# Patient Record
Sex: Male | Born: 2005 | Race: Black or African American | Hispanic: No | Marital: Single | Smoking: Never smoker
Health system: Southern US, Community
[De-identification: ages and names within clinical notes are randomized; demographics above are authoritative.]

## PROBLEM LIST (undated history)

## (undated) DIAGNOSIS — J45909 Unspecified asthma, uncomplicated: Secondary | ICD-10-CM

## (undated) DIAGNOSIS — J189 Pneumonia, unspecified organism: Secondary | ICD-10-CM

## (undated) HISTORY — PX: WISDOM TOOTH EXTRACTION: SHX21

---
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
## 2024-04-18 ENCOUNTER — Emergency Department (HOSPITAL_COMMUNITY)
Admission: EM | Admit: 2024-04-18 | Discharge: 2024-04-18 | Disposition: A | Discharge status: Home / Self Care | Attending: Student | Admitting: Student

## 2024-04-18 ENCOUNTER — Other Ambulatory Visit: Payer: Self-pay

## 2024-04-18 ENCOUNTER — Encounter (HOSPITAL_COMMUNITY): Payer: Self-pay

## 2024-04-18 DIAGNOSIS — R001 Bradycardia, unspecified: Secondary | ICD-10-CM | POA: Diagnosis not present

## 2024-04-18 DIAGNOSIS — T424X1A Poisoning by benzodiazepines, accidental (unintentional), initial encounter: Secondary | ICD-10-CM | POA: Insufficient documentation

## 2024-04-18 DIAGNOSIS — T50904A Poisoning by unspecified drugs, medicaments and biological substances, undetermined, initial encounter: Secondary | ICD-10-CM | POA: Diagnosis not present

## 2024-04-18 DIAGNOSIS — Z7722 Contact with and (suspected) exposure to environmental tobacco smoke (acute) (chronic): Secondary | ICD-10-CM | POA: Insufficient documentation

## 2024-04-18 DIAGNOSIS — Z7952 Long term (current) use of systemic steroids: Secondary | ICD-10-CM | POA: Diagnosis not present

## 2024-04-18 DIAGNOSIS — F22 Delusional disorders: Secondary | ICD-10-CM | POA: Diagnosis not present

## 2024-04-18 DIAGNOSIS — T887XXA Unspecified adverse effect of drug or medicament, initial encounter: Secondary | ICD-10-CM | POA: Diagnosis not present

## 2024-04-18 DIAGNOSIS — Z7951 Long term (current) use of inhaled steroids: Secondary | ICD-10-CM | POA: Diagnosis not present

## 2024-04-18 DIAGNOSIS — T50901A Poisoning by unspecified drugs, medicaments and biological substances, accidental (unintentional), initial encounter: Secondary | ICD-10-CM

## 2024-04-18 DIAGNOSIS — J4520 Mild intermittent asthma, uncomplicated: Secondary | ICD-10-CM | POA: Diagnosis not present

## 2024-04-18 LAB — RAPID URINE DRUG SCREEN, HOSP PERFORMED
Amphetamines: POSITIVE — AB
Barbiturates: NOT DETECTED
Benzodiazepines: NOT DETECTED
Cocaine: NOT DETECTED
Opiates: NOT DETECTED
Tetrahydrocannabinol: POSITIVE — AB

## 2024-04-18 NOTE — ED Triage Notes (Signed)
 Pt took a xanax from a friend Tuesday and states it was laced bc he feels foggy and paranoia since taking med. Axox4. VSS. Denies hallucinations

## 2024-04-18 NOTE — ED Provider Notes (Signed)
 [place] [facility]MENT AT [facility] Provider Note  CSN: [id number] Arrival date & time: 04/18/24 1145  Chief Complaint(s) Drug Problem  HPI Darryl Marsh is a 18 y.o. male who presents emergency room for evaluation of an accidental drug exposure.  Patient states that he used Xanax recreationally a few days ago and has been feeling paranoid since taking this medication.  He states he initially felt drowsy but awoke paranoid and this is an atypical feeling for him.  Endorses using nicotine vapes and smoking marijuana but denies smoking any additional substances or coingestion.  Currently denies chest pain, shortness of breath, abdominal pain, nausea, vomiting or other systemic symptoms.   Past Medical History Past Medical History:  Diagnosis Date   Asthma    Pneumonia    a few years ago, when wheezing started   Patient Active Problem List   Diagnosis Date Noted   Encounter for routine child health examination without abnormal findings 08/14/2017   BMI (body mass index), pediatric, > 99% for age 98/02/2017   Mild intermittent asthma without complication 08/14/2017   Passive smoke exposure 08/14/2017   Status asthmaticus 09/17/2015   Home Medication(s) Prior to Admission medications   Medication Sig Start Date End Date Taking? Authorizing Provider  albuterol  (VENTOLIN  HFA) 108 (90 Base) MCG/ACT inhaler Inhale 2 puffs into the lungs every 4 (four) hours as needed for wheezing or shortness of breath. 10/19/22   Lenord Radon, DO  cetirizine  (ZYRTEC ) 10 MG tablet Take 1 tablet (10 mg total) by mouth daily. 10/19/22   Lenord Radon, DO  fluticasone  (FLONASE ) 50 MCG/ACT nasal spray Place 2 sprays into both nostrils daily. 10/19/22   Lenord Radon, DO  naproxen  (NAPROSYN ) 375 MG tablet Take 1 tablet (375 mg total) by mouth 2 (two) times daily with a meal. 08/06/22   Harris, Abigail, PA-C  predniSONE  (DELTASONE ) 20 MG tablet 3 tabs daily x 5 days, then 2  tabs/day x 2 days, 1 tab/day x 2 days, 1/2 tab/day x 2 days 08/11/22   Lesle Ras, MD  Spacer/Aero-Holding Chambers (AEROCHAMBER PLUS WITH MASK- SMALL) MISC 1 each by Other route once. 05/31/15   Ainsley Houston, MD                                                                                                                                    Past Surgical History Past Surgical History:  Procedure Laterality Date   WISDOM TOOTH EXTRACTION     Family History Family History  Problem Relation Age of Onset   COPD Paternal Grandmother    Hypertension Maternal Grandfather    ADD / ADHD Neg Hx    Cancer Neg Hx    Birth defects Neg Hx    Asthma Neg Hx    Arthritis Neg Hx    Anxiety disorder Neg Hx    Alcohol abuse Neg Hx    Depression Neg Hx  Hyperlipidemia Neg Hx    Heart disease Neg Hx    Hearing loss Neg Hx    Early death Neg Hx    Drug abuse Neg Hx    Diabetes Neg Hx    Intellectual disability Neg Hx    Kidney disease Neg Hx    Learning disabilities Neg Hx    Miscarriages / Stillbirths Neg Hx    Obesity Neg Hx    Stroke Neg Hx    Vision loss Neg Hx    Varicose Veins Neg Hx     Social History Social History   Tobacco Use   Smoking status: Never    Passive exposure: Current   Smokeless tobacco: Never   Tobacco comments:    mom smokes outside:  mom stopped now  Substance Use Topics   Alcohol use: No   Drug use: No   Allergies No healthtouch food allergies  Review of Systems Review of Systems  Psychiatric/Behavioral:         Paranoia    Physical Exam Vital Signs  I have reviewed the triage vital signs BP 128/83 (BP Location: Left Arm)   Pulse 63   Temp 97.6 F (36.4 C) (Oral)   Resp 17   Ht 5\' 9"  (1.753 m)   Wt 95.3 kg   SpO2 100%   BMI 31.01 kg/m   Physical Exam Constitutional:      General: He is not in acute distress.    Appearance: Normal appearance.  HENT:     Head: Normocephalic and atraumatic.     Nose: No congestion or rhinorrhea.   Eyes:     General:        Right eye: No discharge.        Left eye: No discharge.     Extraocular Movements: Extraocular movements intact.     Pupils: Pupils are equal, round, and reactive to light.  Cardiovascular:     Rate and Rhythm: Normal rate and regular rhythm.     Heart sounds: No murmur heard. Pulmonary:     Effort: No respiratory distress.     Breath sounds: No wheezing or rales.  Abdominal:     General: There is no distension.     Tenderness: There is no abdominal tenderness.  Musculoskeletal:        General: Normal range of motion.     Cervical back: Normal range of motion.  Skin:    General: Skin is warm and dry.  Neurological:     General: No focal deficit present.     Mental Status: He is alert.     ED Results and Treatments Labs (all labs ordered are listed, but only abnormal results are displayed) Labs Reviewed  RAPID URINE DRUG SCREEN, HOSP PERFORMED - Abnormal; Notable for the following components:      Result Value   Amphetamines POSITIVE (*)    Tetrahydrocannabinol POSITIVE (*)    All other components within normal limits  Radiology No results found.  Pertinent labs & imaging results that were available during my care of the patient were reviewed by me and considered in my medical decision making (see MDM for details).  Medications Ordered in ED Medications - No data to display                                                                                                                                   Procedures Procedures  (including critical care time)  Medical Decision Making / ED Course   This patient presents to the ED for concern of paranoia, this involves an extensive number of treatment options, and is a complaint that carries with it a high risk of complications and morbidity.  The differential diagnosis  includes sympathomimetic exposure, polysubstance use, drug withdrawal, schizophrenia, psychiatric illness  MDM: Patient seen for evaluation of paranoia.  Physical exam is unremarkable.  UDS is positive for THC and amphetamines.  The amphetamines do come as a surprise to this patient and he states that he has not been using Adderall or other prescription sympathomimetics.  This is likely the exposure that is causing patient's paranoia and at this time he is not displaying any evidence of psychosis here in the department.  Currently does not meet inpatient criteria for admission or criteria for emergency psychiatric consultation.  He was encouraged to not use recreational drugs.  Patient then discharged with outpatient follow-up.  Return precautions given of which he voiced understanding   Additional history obtained: -External records from outside source obtained and reviewed including: Chart review including previous notes, labs, imaging, consultation notes   Lab Tests: -I ordered, reviewed, and interpreted labs.   The pertinent results include:   Labs Reviewed  RAPID URINE DRUG SCREEN, HOSP PERFORMED - Abnormal; Notable for the following components:      Result Value   Amphetamines POSITIVE (*)    Tetrahydrocannabinol POSITIVE (*)    All other components within normal limits      EKG   EKG Interpretation Date/Time:  Thursday Apr 18 2024 11:50:17 EDT Ventricular Rate:  54 PR Interval:  126 QRS Duration:  84 QT Interval:  386 QTC Calculation: 366 R Axis:   79  Text Interpretation: Sinus bradycardia Benign early repolarization No previous ECGs available Confirmed by Chesnee Floren ([id number]) on 04/18/2024 12:09:24 PM         Medicines ordered and prescription drug management: No orders of the defined types were placed in this encounter.   -I have reviewed the patients home medicines and have made adjustments as needed  Critical interventions none     Social Determinants of  Health:  Factors impacting patients care include: Polysubstance use   Reevaluation: After the interventions noted above, I reevaluated the patient and found that they have :stayed the same  Co morbidities that complicate the patient evaluation  Past Medical History:  Diagnosis Date   Asthma  Pneumonia    a few years ago, when wheezing started      Dispostion: I considered admission for this patient, but at this time he does not meet inpatient criteria for admission and will be discharged with outpatient follow-up     Final Clinical Impression(s) / ED Diagnoses Final diagnoses:  OD (overdose of drug), accidental or unintentional, initial encounter     @PCDICTATION @    Karlyn Overman, MD 04/18/24 1345
